# Patient Record
Sex: Female | Born: 1970 | Race: White | Hispanic: No | Marital: Married | Smoking: Current every day smoker
Health system: Southern US, Community
[De-identification: ages and names within clinical notes are randomized; demographics above are authoritative.]

## PROBLEM LIST (undated history)

## (undated) DIAGNOSIS — F329 Major depressive disorder, single episode, unspecified: Secondary | ICD-10-CM

## (undated) DIAGNOSIS — F32A Depression, unspecified: Secondary | ICD-10-CM

## (undated) DIAGNOSIS — E079 Disorder of thyroid, unspecified: Secondary | ICD-10-CM

## (undated) DIAGNOSIS — M1990 Unspecified osteoarthritis, unspecified site: Secondary | ICD-10-CM

## (undated) DIAGNOSIS — F419 Anxiety disorder, unspecified: Secondary | ICD-10-CM

## (undated) HISTORY — DX: Major depressive disorder, single episode, unspecified: F32.9

## (undated) HISTORY — PX: BUNIONECTOMY: SHX129

## (undated) HISTORY — DX: Anxiety disorder, unspecified: F41.9

## (undated) HISTORY — DX: Depression, unspecified: F32.A

## (undated) HISTORY — DX: Unspecified osteoarthritis, unspecified site: M19.90

## (undated) HISTORY — DX: Disorder of thyroid, unspecified: E07.9

---

## 2016-01-18 ENCOUNTER — Ambulatory Visit (INDEPENDENT_AMBULATORY_CARE_PROVIDER_SITE_OTHER): Payer: Managed Care, Other (non HMO)

## 2016-01-18 ENCOUNTER — Ambulatory Visit (INDEPENDENT_AMBULATORY_CARE_PROVIDER_SITE_OTHER): Payer: Managed Care, Other (non HMO) | Admitting: Physician Assistant

## 2016-01-18 VITALS — BP 127/73 | HR 84 | Temp 98.4°F | Resp 20 | Ht 63.0 in | Wt 126.0 lb

## 2016-01-18 DIAGNOSIS — S92301A Fracture of unspecified metatarsal bone(s), right foot, initial encounter for closed fracture: Secondary | ICD-10-CM

## 2016-01-18 NOTE — Progress Notes (Signed)
Subjective:   Patient ID: Kayla Fox, female     DOB: Apr 04, 1971, 45 y.o.    MRN: [id number]  PCP: No primary care provider on file.  Chief Complaint  Patient presents with  . Other    x ray right foot injury    HPI  Presents for recheck Xray following a right 5th metatarsal fracture 10 weeks ago.   The patient fractured her 5th metatarsal on 11/10/15 at home in [place], and has been walking with a boot ever since. She is here in [place] taking care of her dad with leukemia for an unknown amount of time (originally was to be here only 2 weeks, arriving the day following her injury). She is supposed to have a follow up appointment with her orthopedic office tomorrow for a fracture recheck, but as she is in [place], cannot make it to that appointment. She denies pain, swelling, numbness, tingling, tenderness or ecchymoses at this time. She states she is able to wiggle toes and has no discomfort. When she removes the CAM walker for bathing, she can weight bear without pain.  Prior to Admission medications   Medication Sig Start Date End Date Taking? Authorizing Provider  celecoxib (CELEBREX) 200 MG capsule Take 200 mg by mouth 2 (two) times daily.   Yes Historical Provider, MD  DULoxetine (CYMBALTA) 60 MG capsule Take 60 mg by mouth daily.   Yes Historical Provider, MD  hydroxychloroquine (PLAQUENIL) 200 MG tablet Take by mouth daily.   Yes Historical Provider, MD  lamoTRIgine (LAMICTAL) 200 MG tablet Take 200 mg by mouth daily.   Yes Historical Provider, MD  levothyroxine (SYNTHROID, LEVOTHROID) 75 MCG tablet Take 75 mcg by mouth daily before breakfast.   Yes Historical Provider, MD     No Known Allergies   There are no active problems to display for this patient.    No family history on file.   Social History   Social History  . Marital Status: Married    Spouse Name: N/A  . Number of Children: N/A  . Years of Education: N/A   Occupational History  . Not on  file.   Social History Main Topics  . Smoking status: Current Every Day Smoker -- 0.50 packs/day    Types: Cigarettes  . Smokeless tobacco: Not on file  . Alcohol Use: 3.0 oz/week    5 Standard drinks or equivalent per week  . Drug Use: No  . Sexual Activity: Not on file   Other Topics Concern  . Not on file   Social History Narrative  . No narrative on file        Review of Systems As above.      Objective:  Physical Exam  Constitutional: She is oriented to person, place, and time. She appears well-developed and well-nourished. She is active and cooperative. No distress.  BP 127/73 mmHg  Pulse 84  Temp(Src) 98.4 F (36.9 C) (Oral)  Resp 20  Ht  (1.6 m)  Wt 126 lb (57.153 kg)  BMI 22.33 kg/m2  SpO2 98%  LMP 01/15/2016   Eyes: Conjunctivae are normal.  Pulmonary/Chest: Effort normal.  Musculoskeletal:       Right ankle: Normal. Achilles tendon normal.       Right foot: Normal.  Neurological: She is alert and oriented to person, place, and time.  Psychiatric: She has a normal mood and affect. Her speech is normal and behavior is normal.         Dg Foot Complete  Right  01/18/2016  CLINICAL DATA:  45 year old who sustained a fractured 5th metatarsal approximately 10 weeks ago in [place]. Subsequent encounter. EXAM: RIGHT FOOT COMPLETE - 3+ VIEW COMPARISON:  None. FINDINGS: Healing fracture involving the distal 5th metatarsal with callus, though the healing is not yet complete and solid. No acute fractures. Prior fusion of the 1st metatarsal and proximal phalanx of the great toe which does appear solid. Joint spaces well preserved. Bone mineral density well-preserved. Accessory ossicle between the calcaneus and talus. IMPRESSION: 1. Healing fracture involving the distal 5th metatarsal with callus formation, though the healing is not yet complete. 2. No acute osseous abnormality. 3. Prior fusion of the 1st metatarsal to the proximal phalanx of the great toe which  appears solid. Electronically Signed   By: Hulan Saashomas  Lawrence M.D.   On: 01/18/2016 16:14       Assessment & Plan:  1. Fracture of 5th metatarsal, right, closed, initial encounter Incomplete healing of fracture on radiographs today, but she is asymptomatic. Advise she can cautiously come out of the boot. If she has pain with walking, resume use x 2 weeks, then try again. She may find that she needs the boot if she is to stand or walk for extended periods, so advised to keep the CAM walker available for use as needed. - DG Foot Complete Right; Future   Fernande Brashelle S. Shevonne Wolf, PA-C Physician Assistant-Certified [facility]

## 2016-01-18 NOTE — Progress Notes (Signed)
Subjective:     Patient ID: Kayla Fox, female   DOB: [birth year], 45 y.o.   MRN: [id number]  HPI Patient is a 45 year old female with a PMH of rheumatoid arthritis, hypothyroidism, and osteopenia who presents today for a recheck Xray following a right 5th metatarsal fracture 10 weeks ago. The patient fractured her 5th metatarsal on 11/10/15 and has been walking with a boot ever since. She is from [place] and is here in [place] taking care of her dad with leukemia for an unknown amount of time. She is supposed to have a follow up appointment with her orthopedic office tomorrow for a fracture recheck. She denies pain, swelling, numbness, tingling, tenderness or ecchymoses at this time. She states she is able to wiggle toes and has no discomfort.   Review of Systems  Pertinent ROS as above in HPI     Objective:   Physical Exam  Constitutional: She appears well-developed and well-nourished. No distress.  Cardiovascular: Intact distal pulses.   Musculoskeletal:  Skin is moist, ankle and metatarsals nontender to palpation. Full passive and active range of motion of Right ankle, metatarsals and interphalange joints. 5/5 muscle strength of ankle and metatarsals.  Skin: She is not diaphoretic.     Dg Foot Complete Right  01/18/2016  CLINICAL DATA:  45 year old who sustained a fractured 5th metatarsal approximately 10 weeks ago in [place]. Subsequent encounter. EXAM: RIGHT FOOT COMPLETE - 3+ VIEW COMPARISON:  None. FINDINGS: Healing fracture involving the distal 5th metatarsal with callus, though the healing is not yet complete and solid. No acute fractures. Prior fusion of the 1st metatarsal and proximal phalanx of the great toe which does appear solid. Joint spaces well preserved. Bone mineral density well-preserved. Accessory ossicle between the calcaneus and talus. IMPRESSION: 1. Healing fracture involving the distal 5th metatarsal with callus formation, though the healing is not yet complete. 2. No  acute osseous abnormality. 3. Prior fusion of the 1st metatarsal to the proximal phalanx of the great toe which appears solid. Electronically Signed   By: Hulan Saashomas  Lawrence M.D.   On: 01/18/2016 16:14       Assessment:     Patient is a 45 year old female with a past right 5th metatarsal fracture which is currently healing. Physical exam she has no tenderness or ROM difficulties, Xray today revealing healing callous. Patient is able to ambulate and weight bare, I suggest she can proceed with activity as tolerated of the right foot.    Plan:     1. Fracture of 5th metatarsal, right, closed, initial encounter - XRAY Foot Complete Right; Future  - Patient can remove the boot, weight bare on the right foot and resume activity as tolerated in small amounts - Educated the patient on slow progression and not standing on feet for too many long hours - Proceed with caution for about 4 weeks - Return to the office if there is increased in pain, swelling, or tenderness in foot.

## 2016-01-18 NOTE — Patient Instructions (Addendum)
Because you received an x-ray today, you will receive an invoice from [facility]. Please contact [facility] at [phone] with questions or concerns regarding your invoice. Our billing staff will not be able to assist you with those questions.  You may stop wearing the boot for routine activity. If you will be doing extensive standing/walking, please use the boot for protection/support, due to incomplete healing of the fracture.  Dg Foot Complete Right  01/18/2016  IMPRESSION: 1. Healing fracture involving the distal 5th metatarsal with callus formation, though the healing is not yet complete. 2. No acute osseous abnormality. 3. Prior fusion of the 1st metatarsal to the proximal phalanx of the great toe which appears solid. Electronically Signed   By: Hulan Saashomas  Lawrence M.D.   On: 01/18/2016 16:14

## 2016-09-17 IMAGING — CR DG FOOT COMPLETE 3+V*R*
3 series · 3 of 3 positions shown · non-contrast
Comparison: None.

CLINICAL DATA: 45-year-old who sustained a fractured 5th metatarsal
approximately 10 weeks ago in [HOSPITAL]. Subsequent encounter.

EXAM:
RIGHT FOOT COMPLETE - 3+ VIEW

[AP]
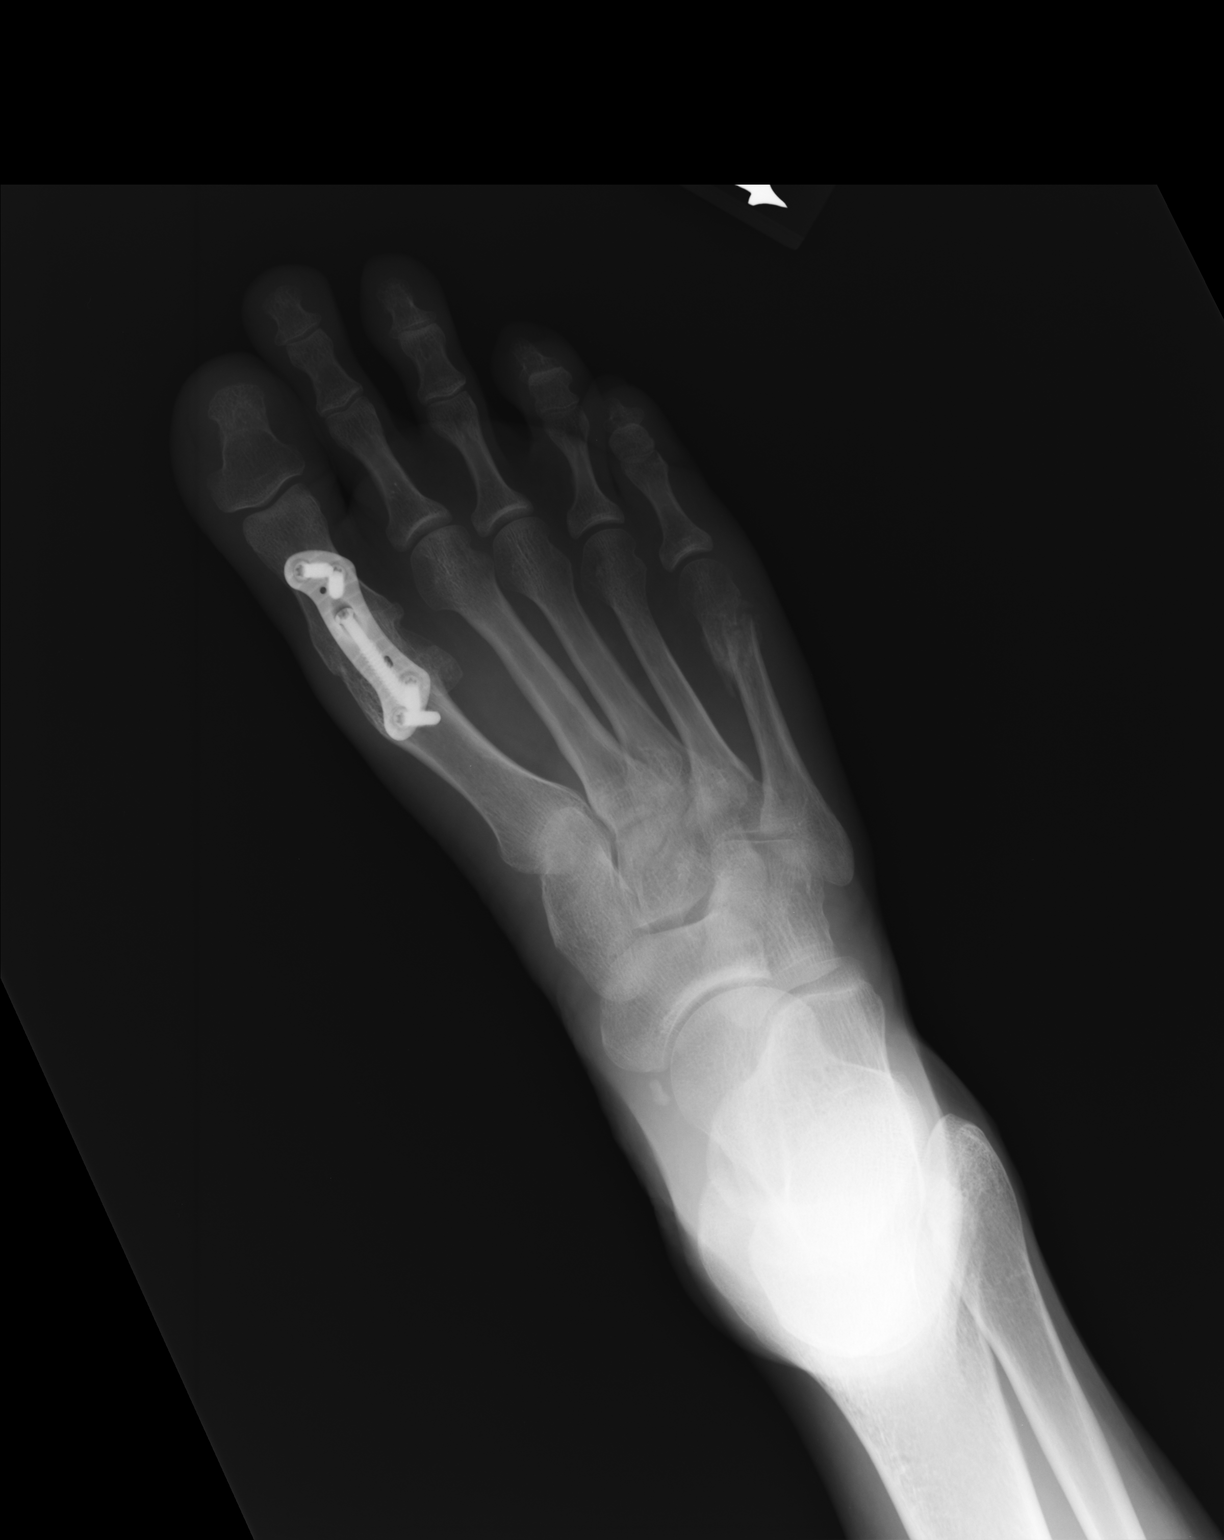

[ap obl int rot]
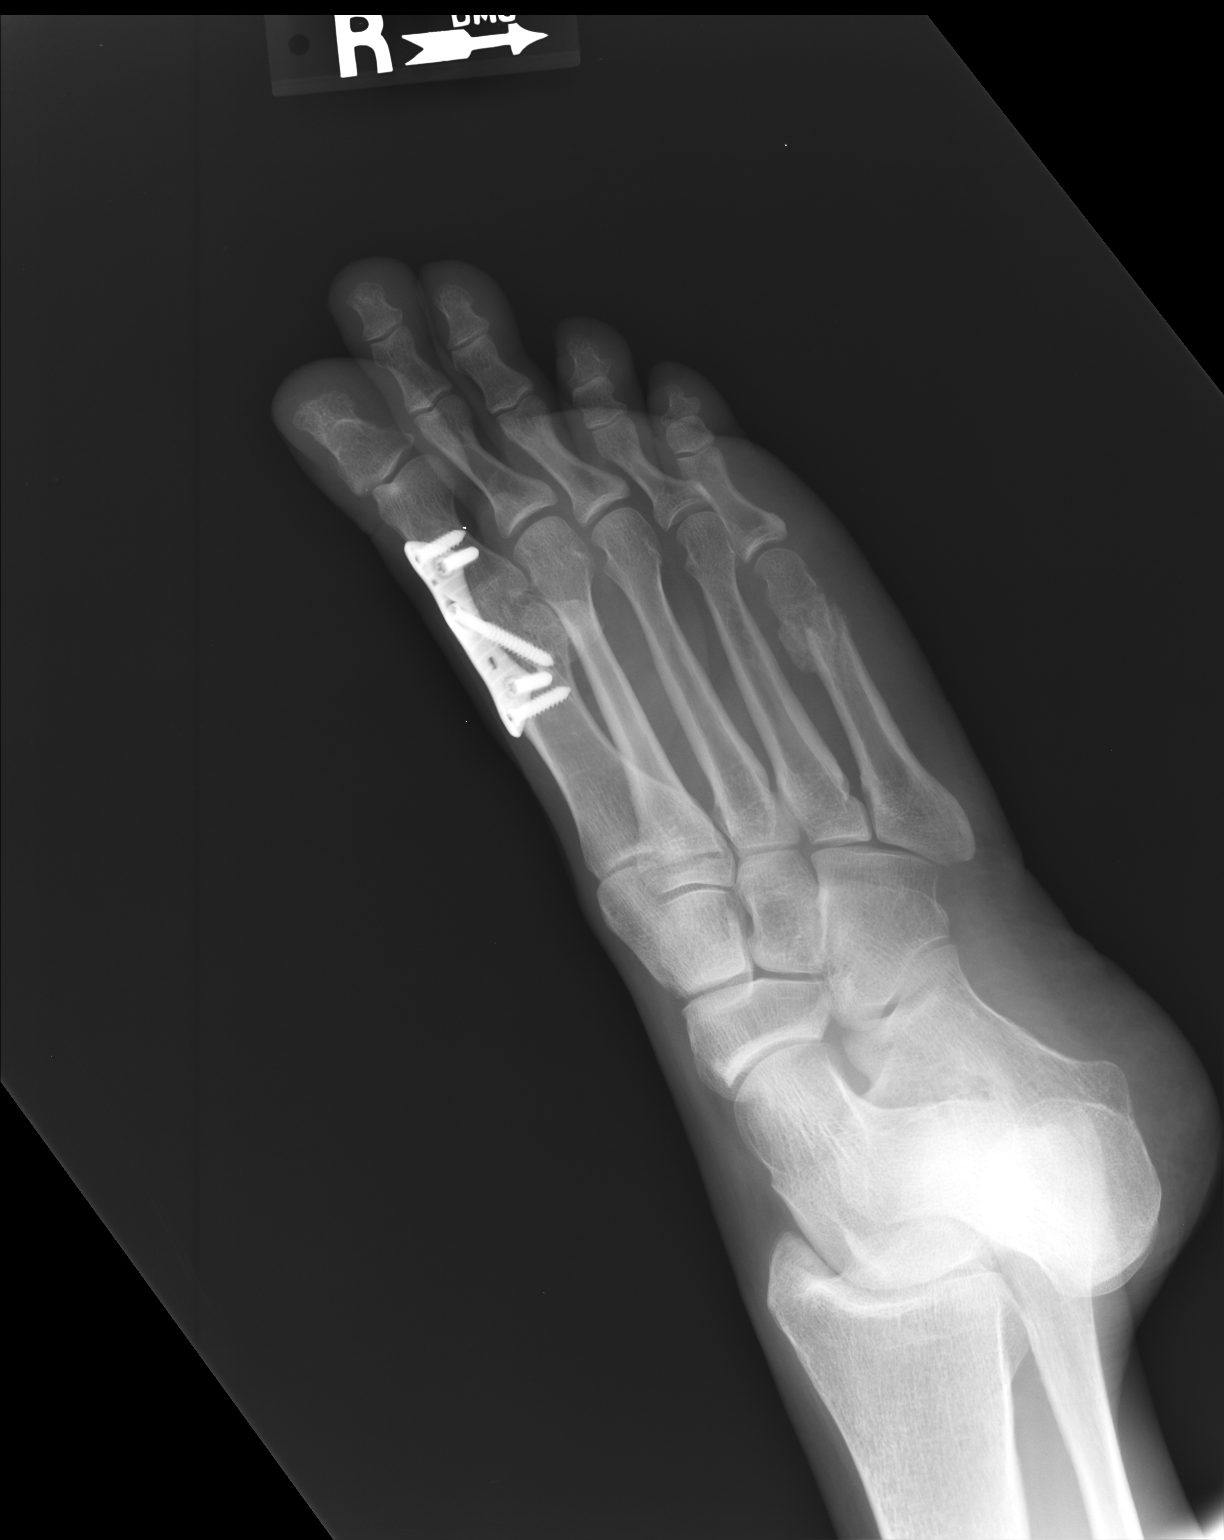

[lateral]
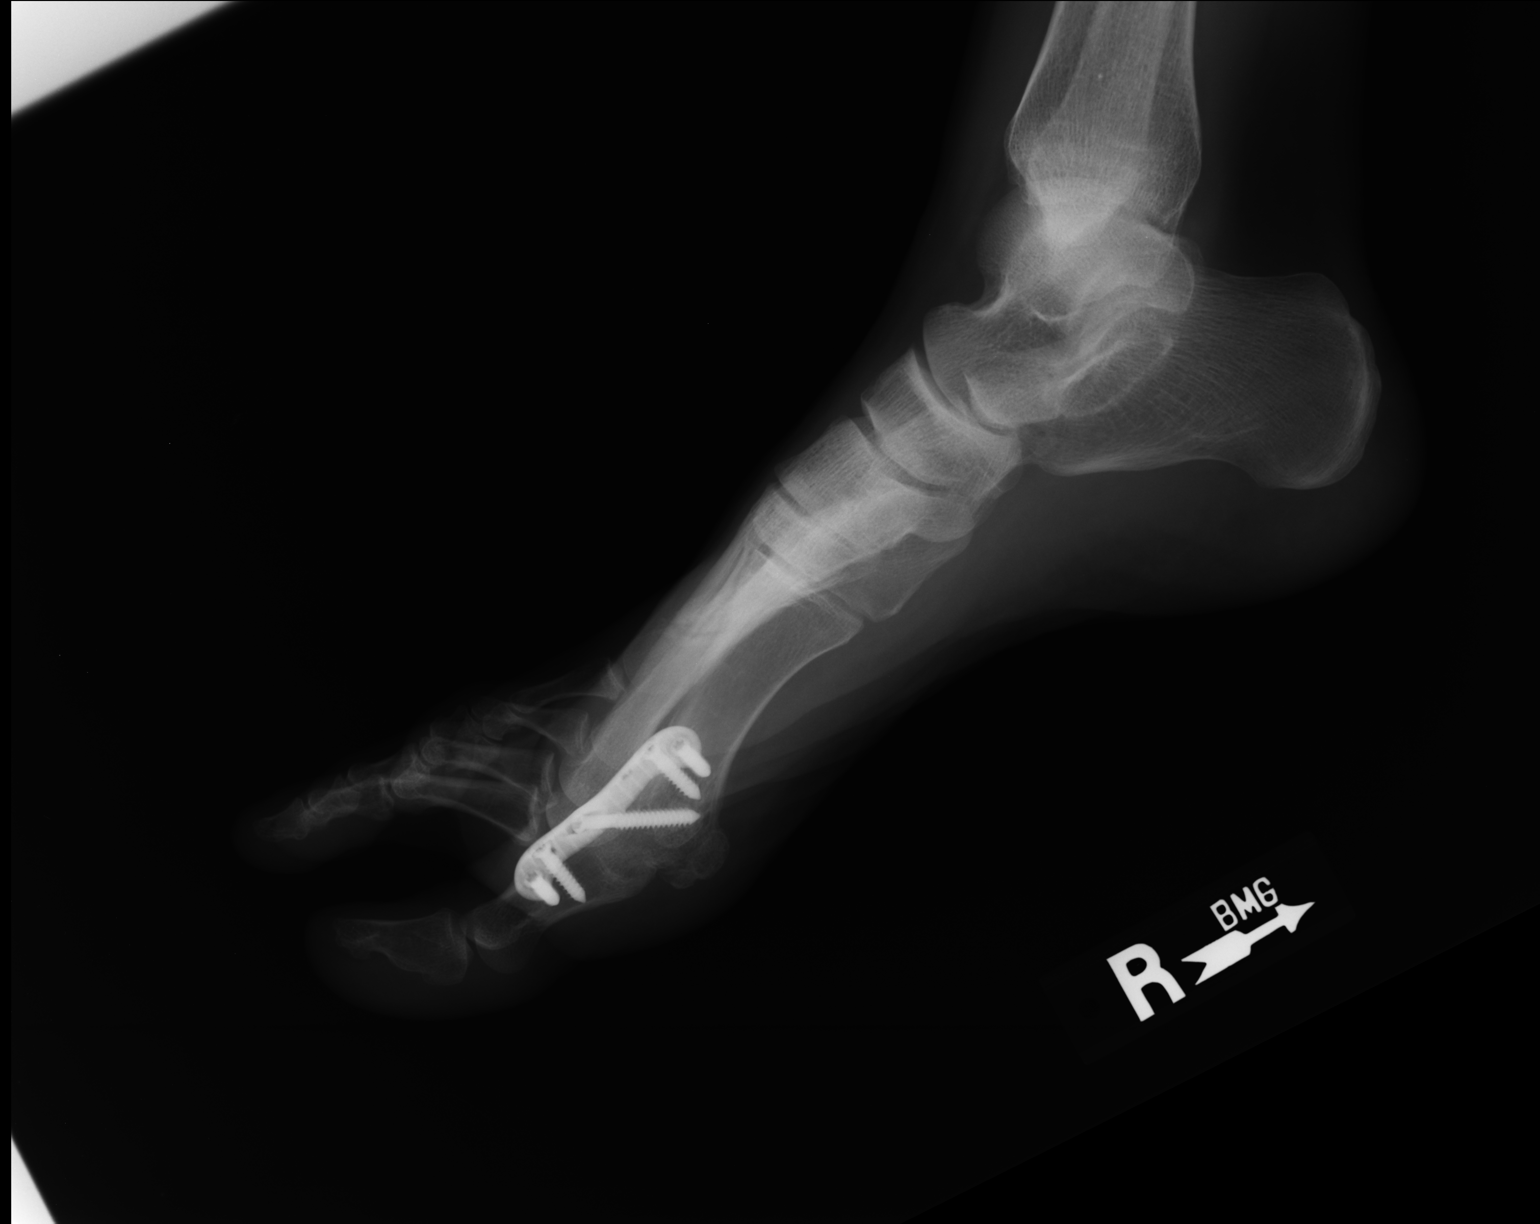

[3 of 3 positions shown; findings below may reference images not displayed]

FINDINGS: Healing fracture involving the distal 5th metatarsal with callus,
though the healing is not yet complete and solid. No acute
fractures. Prior fusion of the 1st metatarsal and proximal phalanx
of the great toe which does appear solid. Joint spaces well
preserved. Bone mineral density well-preserved. Accessory ossicle
between the calcaneus and talus.
IMPRESSION: 1. Healing fracture involving the distal 5th metatarsal with callus
formation, though the healing is not yet complete.
2. No acute osseous abnormality.
3. Prior fusion of the 1st metatarsal to the proximal phalanx of the
great toe which appears solid.
